# Patient Record
Sex: Female | Born: 1995 | Race: Black or African American | Hispanic: No | Marital: Single | Smoking: Never smoker
Health system: Southern US, Community
[De-identification: ages and names within clinical notes are randomized; demographics above are authoritative.]

## PROBLEM LIST (undated history)

## (undated) DIAGNOSIS — J45909 Unspecified asthma, uncomplicated: Secondary | ICD-10-CM

## (undated) DIAGNOSIS — B009 Herpesviral infection, unspecified: Secondary | ICD-10-CM

---

## 2016-08-22 ENCOUNTER — Emergency Department: Payer: Managed Care, Other (non HMO)

## 2016-08-22 ENCOUNTER — Encounter: Payer: Self-pay | Admitting: Emergency Medicine

## 2016-08-22 ENCOUNTER — Emergency Department
Admission: EM | Admit: 2016-08-22 | Discharge: 2016-08-22 | Disposition: A | Discharge status: Home / Self Care | Payer: Managed Care, Other (non HMO) | Attending: Student in an Organized Health Care Education/Training Program | Admitting: Student in an Organized Health Care Education/Training Program

## 2016-08-22 DIAGNOSIS — M545 Low back pain: Secondary | ICD-10-CM | POA: Diagnosis not present

## 2016-08-22 DIAGNOSIS — Y9389 Activity, other specified: Secondary | ICD-10-CM | POA: Diagnosis not present

## 2016-08-22 DIAGNOSIS — J45909 Unspecified asthma, uncomplicated: Secondary | ICD-10-CM | POA: Insufficient documentation

## 2016-08-22 DIAGNOSIS — M25571 Pain in right ankle and joints of right foot: Secondary | ICD-10-CM | POA: Insufficient documentation

## 2016-08-22 DIAGNOSIS — Y999 Unspecified external cause status: Secondary | ICD-10-CM | POA: Diagnosis not present

## 2016-08-22 DIAGNOSIS — Y92410 Unspecified street and highway as the place of occurrence of the external cause: Secondary | ICD-10-CM | POA: Diagnosis not present

## 2016-08-22 DIAGNOSIS — R51 Headache: Secondary | ICD-10-CM | POA: Insufficient documentation

## 2016-08-22 DIAGNOSIS — S0990XA Unspecified injury of head, initial encounter: Secondary | ICD-10-CM | POA: Diagnosis present

## 2016-08-22 HISTORY — DX: Unspecified asthma, uncomplicated: J45.909

## 2016-08-22 LAB — POCT PREGNANCY, URINE: Preg Test, Ur: NEGATIVE

## 2016-08-22 MED ORDER — TRAMADOL HCL 50 MG PO TABS: 50.0000 mg | ORAL_TABLET | Freq: Four times a day (QID) | ORAL | 0 refills | Status: AC | PRN

## 2016-08-22 MED ORDER — HYDROCODONE-ACETAMINOPHEN 5-325 MG PO TABS
1.0000 | ORAL_TABLET | Freq: Once | ORAL | Status: AC
  Administered 2016-08-22: 1 via ORAL
  Filled 2016-08-22: qty 1

## 2016-08-22 MED ORDER — CYCLOBENZAPRINE HCL 5 MG PO TABS: 5.0000 mg | ORAL_TABLET | Freq: Three times a day (TID) | ORAL | 0 refills | Status: AC | PRN
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
## 2016-08-22 NOTE — ED Notes (Signed)
Pt verbalized understanding of discharge instructions. NAD at this time. 

## 2016-08-22 NOTE — ED Notes (Signed)
This RN informed patient that urine sample was needed, pt given a urine cup. Pt ambulatory to the bathroom, c/o headache, this RN offered patient wheelchair several times, pt states "I'm okay, I can walk".
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
## 2020-10-18 ENCOUNTER — Emergency Department (HOSPITAL_COMMUNITY)
Admission: EM | Admit: 2020-10-18 | Discharge: 2020-10-18 | Disposition: A | Discharge status: Home / Self Care | Payer: No Typology Code available for payment source | Attending: Emergency Medicine | Admitting: Emergency Medicine

## 2020-10-18 ENCOUNTER — Encounter (HOSPITAL_COMMUNITY): Payer: Self-pay | Admitting: Emergency Medicine

## 2020-10-18 ENCOUNTER — Emergency Department (HOSPITAL_COMMUNITY): Payer: No Typology Code available for payment source

## 2020-10-18 DIAGNOSIS — J45909 Unspecified asthma, uncomplicated: Secondary | ICD-10-CM | POA: Insufficient documentation

## 2020-10-18 DIAGNOSIS — R202 Paresthesia of skin: Secondary | ICD-10-CM | POA: Diagnosis not present

## 2020-10-18 DIAGNOSIS — S161XXA Strain of muscle, fascia and tendon at neck level, initial encounter: Secondary | ICD-10-CM | POA: Diagnosis not present

## 2020-10-18 DIAGNOSIS — Y92410 Unspecified street and highway as the place of occurrence of the external cause: Secondary | ICD-10-CM | POA: Diagnosis not present

## 2020-10-18 DIAGNOSIS — S39012A Strain of muscle, fascia and tendon of lower back, initial encounter: Secondary | ICD-10-CM | POA: Diagnosis not present

## 2020-10-18 DIAGNOSIS — S199XXA Unspecified injury of neck, initial encounter: Secondary | ICD-10-CM | POA: Diagnosis present

## 2020-10-18 MED ORDER — DEXAMETHASONE SODIUM PHOSPHATE 10 MG/ML IJ SOLN
8.0000 mg | Freq: Once | INTRAMUSCULAR | Status: AC
  Administered 2020-10-18: 8 mg via INTRAMUSCULAR
  Filled 2020-10-18: qty 1

## 2020-10-18 MED ORDER — NAPROXEN 500 MG PO TABS
500.0000 mg | ORAL_TABLET | Freq: Two times a day (BID) | ORAL | 0 refills | Status: DC
Start: 2020-10-18 — End: 2023-04-07

## 2020-10-18 MED ORDER — CYCLOBENZAPRINE HCL 10 MG PO TABS: 10.0000 mg | ORAL_TABLET | Freq: Every day | ORAL | 0 refills | Status: AC

## 2020-10-18 MED ORDER — IBUPROFEN 400 MG PO TABS
600.0000 mg | ORAL_TABLET | Freq: Once | ORAL | Status: AC
  Administered 2020-10-18: 600 mg via ORAL
  Filled 2020-10-18: qty 1
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
## 2020-10-18 NOTE — Discharge Instructions (Addendum)
I am prescribing you a strong muscle relaxer called flexeril. Please only take this medication once in the evening with dinner. This medication can make you quite drowsy. Do not mix it with alcohol. Do not drive a vehicle after taking it.   I am also prescribed you naproxen.  This is a strong anti-inflammatory medication.  You can take this twice a day for management of your pain.  Please take this with a small amount of food to prevent stomach irritation.  Again, these medications are not safe to take during pregnancy.  If there is any concern that you could be pregnant, you need to take a pregnancy test at home prior to starting these medications.  Please continue to closely monitor your symptoms.  If you develop any new or worsening symptoms, please return to the emergency department immediately for reevaluation.  It was a pleasure to meet you.
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
## 2020-10-18 NOTE — ED Notes (Signed)
Dc instructions reviewed with pt. PT verbalized understanding. PT DC 

## 2023-04-07 ENCOUNTER — Ambulatory Visit (INDEPENDENT_AMBULATORY_CARE_PROVIDER_SITE_OTHER): Payer: Medicaid Other

## 2023-04-07 ENCOUNTER — Encounter (HOSPITAL_COMMUNITY): Payer: Self-pay

## 2023-04-07 ENCOUNTER — Ambulatory Visit (HOSPITAL_COMMUNITY)
Admission: EM | Admit: 2023-04-07 | Discharge: 2023-04-07 | Disposition: A | Discharge status: Home / Self Care | Payer: Medicaid Other | Attending: Family Medicine | Admitting: Family Medicine

## 2023-04-07 DIAGNOSIS — R079 Chest pain, unspecified: Secondary | ICD-10-CM

## 2023-04-07 LAB — POCT URINE PREGNANCY: Preg Test, Ur: NEGATIVE

## 2023-04-07 NOTE — ED Triage Notes (Signed)
Patient here today with c/o left side chest pain and dizziness X 1 hour. Patient has been massaging her chest which seemed to have helped some.

## 2023-04-08 ENCOUNTER — Encounter (HOSPITAL_BASED_OUTPATIENT_CLINIC_OR_DEPARTMENT_OTHER): Payer: Self-pay | Admitting: Emergency Medicine

## 2023-04-08 ENCOUNTER — Other Ambulatory Visit: Payer: Self-pay

## 2023-04-08 ENCOUNTER — Other Ambulatory Visit (HOSPITAL_BASED_OUTPATIENT_CLINIC_OR_DEPARTMENT_OTHER): Payer: Self-pay

## 2023-04-08 ENCOUNTER — Emergency Department (HOSPITAL_BASED_OUTPATIENT_CLINIC_OR_DEPARTMENT_OTHER)
Admission: EM | Admit: 2023-04-08 | Discharge: 2023-04-08 | Disposition: A | Discharge status: Home / Self Care | Payer: Medicaid Other | Attending: Emergency Medicine | Admitting: Emergency Medicine

## 2023-04-08 DIAGNOSIS — R079 Chest pain, unspecified: Secondary | ICD-10-CM | POA: Diagnosis present

## 2023-04-08 DIAGNOSIS — R0789 Other chest pain: Secondary | ICD-10-CM | POA: Diagnosis not present

## 2023-04-08 DIAGNOSIS — R0602 Shortness of breath: Secondary | ICD-10-CM | POA: Diagnosis not present

## 2023-04-08 LAB — BASIC METABOLIC PANEL
Anion gap: 8 (ref 5–15)
BUN: 18 mg/dL (ref 6–20)
CO2: 25 mmol/L (ref 22–32)
Calcium: 9.2 mg/dL (ref 8.9–10.3)
Chloride: 102 mmol/L (ref 98–111)
Creatinine, Ser: 0.95 mg/dL (ref 0.44–1.00)
GFR, Estimated: >60 mL/min (ref >60)
Glucose, Bld: 92 mg/dL (ref 70–99)
Potassium: 3.6 mmol/L (ref 3.5–5.1)
Sodium: 135 mmol/L (ref 135–145)

## 2023-04-08 LAB — CBC
HCT: 40 % (ref 36.0–46.0)
Hemoglobin: 13.4 g/dL (ref 12.0–15.0)
MCH: 30 pg (ref 26.0–34.0)
MCHC: 33.5 g/dL (ref 30.0–36.0)
MCV: 89.7 fL (ref 80.0–100.0)
Platelets: 323 10*3/uL (ref 150–400)
RBC: 4.46 MIL/uL (ref 3.87–5.11)
RDW: 13.1 % (ref 11.5–15.5)
WBC: 9.2 10*3/uL (ref 4.0–10.5)
nRBC: 0 % (ref 0.0–0.2)

## 2023-04-08 LAB — TROPONIN I (HIGH SENSITIVITY): Troponin I (High Sensitivity): <2 ng/L (ref <18)

## 2023-04-08 LAB — D-DIMER, QUANTITATIVE: D-Dimer, Quant: 0.4 ug{FEU}/mL (ref 0.00–0.50)

## 2023-04-08 MED ORDER — IBUPROFEN 600 MG PO TABS
600.0000 mg | ORAL_TABLET | Freq: Four times a day (QID) | ORAL | 0 refills | Status: DC | PRN
  Filled 2023-04-08: qty 30, 8d supply, fill #0

## 2023-04-08 MED ORDER — KETOROLAC TROMETHAMINE 15 MG/ML IJ SOLN
15.0000 mg | Freq: Once | INTRAMUSCULAR | Status: AC
Start: 2023-04-08 — End: 2023-04-08
  Administered 2023-04-08: 15 mg via INTRAVENOUS
  Filled 2023-04-08: qty 1

## 2023-04-08 NOTE — ED Triage Notes (Signed)
Left side chest heaviness and "slight" shortness of breath , was seen yesterday for same at [facility] . Denies cough or congestion .
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
## 2023-04-08 NOTE — ED Notes (Signed)

## 2023-04-08 NOTE — Discharge Instructions (Addendum)
As discussed, workup today overall reassuring.  D-dimer was negative so low suspicion for blood clot in the lung.  Your heart enzyme was normal.  Your EKG appeared normal.  Suspect your symptoms are likely secondary to left-sided chest wall pain or more musculoskeletal origin.  Will recommend treatment of pain at home with NSAIDs.  Recommend follow-up with primary care for reassessment of your symptoms.  Please do not hesitate to return to emergency department for worrisome signs and symptoms we discussed to become apparent.

## 2023-04-08 NOTE — ED Provider Notes (Signed)
[facility]ARE [facility]   [id number] 04/07/23 Arrival Time: 1606  ASSESSMENT & PLAN:  1. Chest pain, unspecified type    Low suspicion, but cannot r/o cardiac CP vs PE. Discussed. Recommend ED evaluation for more thorough workup. Pt agrees. To ED via POV. Stable upon discharge.  ECG: Performed today and interpreted by me: normal EKG, normal sinus rhythm.  I have personally viewed the imaging studies ordered this visit. CXR: no acute changes.  Labs Reviewed  POCT URINE PREGNANCY  Negative.  Reviewed expectations re: course of current medical issues. Questions answered. Outlined signs and symptoms indicating need for more acute intervention. Patient verbalized understanding. After Visit Summary given.   SUBJECTIVE:  History from: patient. Tasha Thompson is a 27 y.o. female who presents with complaint of L upper CP; abrupt onset; this morning; few min then eased. With associated diaphoresis. "12 out of 10 pain". Still present but around 2/10 pain. Denies assoc n/v. Ques mild SOB associated; none currently. Denies back/abd pain. No tx PTA. Denies recreational drug use.  Social History   Tobacco Use  Smoking Status Never  Smokeless Tobacco Never   Social History   Substance and Sexual Activity  Alcohol Use No     OBJECTIVE:  Vitals:   04/07/23 1711  BP: 127/79  Pulse: (!) 101  Resp: 16  Temp: 99 F (37.2 C)  TempSrc: Oral  SpO2: 97%  Weight: 90.7 kg  Height: 5\' 5"  (1.651 m)    General appearance: alert, oriented, no acute distress Eyes: PERRLA; EOMI; conjunctivae normal HENT: normocephalic; atraumatic Neck: supple with FROM Lungs: without labored respirations; speaks full sentences without difficulty; CTAB Heart: regular rate and rhythm without murmer Chest Wall: without tenderness to palpation Abdomen: soft, non-tender; no guarding or rebound tenderness Extremities: without edema; without calf swelling or tenderness; symmetrical without gross  deformities Skin: warm and dry; without rash or lesions Neuro: normal gait Psychological: alert and cooperative; normal mood and affect  Labs: Results for orders placed or performed during the hospital encounter of 04/07/23  POCT urine pregnancy  Result Value Ref Range   Preg Test, Ur Negative Negative   Labs Reviewed  POCT URINE PREGNANCY    Imaging: DG Chest 2 View  Result Date: 04/07/2023 CLINICAL DATA:  CP left side chest pain and dizziness X 1 hour. Patient has been massaging her chest which seemed to have helped some. EXAM: CHEST - 2 VIEW COMPARISON:  None Available. FINDINGS: The heart and mediastinal contours are within normal limits. No focal consolidation. No pulmonary edema. No pleural effusion. No pneumothorax. No acute osseous abnormality. IMPRESSION: No active cardiopulmonary disease. Electronically Signed   By: Tish Frederickson M.D.   On: 04/07/2023 19:52     No Known Allergies  Past Medical History:  Diagnosis Date   Asthma    Social History   Socioeconomic History   Marital status: Single    Spouse name: Not on file   Number of children: Not on file   Years of education: Not on file   Highest education level: Not on file  Occupational History   Not on file  Tobacco Use   Smoking status: Never   Smokeless tobacco: Never  Substance and Sexual Activity   Alcohol use: No   Drug use: No   Sexual activity: Not on file  Other Topics Concern   Not on file  Social History Narrative   Not on file   Social Determinants of Health   Financial Resource Strain: Low Risk  (  10/04/2019)   Received from [facility]   Overall Financial Resource Strain (CARDIA)    Difficulty of Paying Living Expenses: Not very hard  Food Insecurity: No Food Insecurity (10/04/2019)   Received from [facility]   Hunger Vital Sign    Worried About Running Out of Food in the Last Year: Never true    Ran Out of Food in the Last Year: Never true  Transportation Needs: No  Transportation Needs (10/22/2020)   Received from [facility] - Transportation    Lack of Transportation (Medical): No    Lack of Transportation (Non-Medical): No  Physical Activity: Insufficiently Active (09/27/2020)   Received from [facility]   Exercise Vital Sign    Days of Exercise per Week: 3 days    Minutes of Exercise per Session: 30 min  Stress: Stress Concern Present (10/04/2019)   Received from [facility] of Occupational Health - Occupational Stress Questionnaire    Feeling of Stress : To some extent  Social Connections: Moderately Isolated (10/04/2019)   Received from [facility]   Social Connection and Isolation Panel [NHANES]    Frequency of Communication with Friends and Family: Three times a week    Frequency of Social Gatherings with Friends and Family: Twice a week    Attends Religious Services: 1 to 4 times per year    Active Member of [employer] or Organizations: No    Attends Banker Meetings: Never    Marital Status: Never married  Intimate Partner Violence: Not At Risk (10/04/2019)   Received from [facility]   Humiliation, Afraid, Rape, and Kick questionnaire    Fear of Current or Ex-Partner: No    Emotionally Abused: No    Physically Abused: No    Sexually Abused: No   History reviewed. No pertinent family history. History reviewed. No pertinent surgical history.    Mardella Layman, MD 04/08/23 1218

## 2023-04-19 ENCOUNTER — Other Ambulatory Visit (HOSPITAL_BASED_OUTPATIENT_CLINIC_OR_DEPARTMENT_OTHER): Payer: Self-pay

## 2023-07-06 ENCOUNTER — Ambulatory Visit (HOSPITAL_COMMUNITY)
Admission: EM | Admit: 2023-07-06 | Discharge: 2023-07-06 | Disposition: A | Discharge status: Home / Self Care | Payer: Medicaid Other | Attending: Family Medicine | Admitting: Family Medicine

## 2023-07-06 ENCOUNTER — Encounter (HOSPITAL_COMMUNITY): Payer: Self-pay

## 2023-07-06 DIAGNOSIS — N760 Acute vaginitis: Secondary | ICD-10-CM | POA: Insufficient documentation

## 2023-07-06 NOTE — Discharge Instructions (Signed)
Staff will notify you if there is anything positive on the swab.  You can use the QR code/website at the back of the summary paperwork to schedule yourself a new patient appointment with primary care.  When you make that appointment, then please notify Medicaid so they can assign you that new primary care person.

## 2023-07-06 NOTE — ED Triage Notes (Signed)
Patient here today with c/o vaginal itching X 1 week.

## 2023-07-06 NOTE — ED Provider Notes (Signed)
[facility]    CSN: [id number] Arrival date & time: 07/06/23  1555      History   Chief Complaint Chief Complaint  Patient presents with   Vaginal Itching    HPI Tasha Thompson is a 28 y.o. female.    Vaginal Itching  Here for some vaginal irritation and intermittent itching.  It has been going on for a week or 2.  She wonders if it was some exposure to some different soaps she had used.  She is also had a little bit of discharge.  No abnormal smell.  No abdominal pain  Last menstrual cycle was about December 21 or 22.  NKDA  She did also have some irritation in her mouth after eating some not completely right kiwi.  That is resolved.  Past Medical History:  Diagnosis Date   Asthma     There are no active problems to display for this patient.   History reviewed. No pertinent surgical history.  OB History   No obstetric history on file.      Home Medications    Prior to Admission medications   Medication Sig Start Date End Date Taking? Authorizing Provider  valACYclovir (VALTREX) 1000 MG tablet Take 1 tablet by mouth daily. 03/23/23   [provider]    Family History History reviewed. No pertinent family history.  Social History Social History   Tobacco Use   Smoking status: Never   Smokeless tobacco: Never  Substance Use Topics   Alcohol use: No   Drug use: No     Allergies   Patient has no known allergies.   Review of Systems Review of Systems   Physical Exam Triage Vital Signs ED Triage Vitals  Encounter Vitals Group     BP 07/06/23 1630 115/81     Systolic BP Percentile --      Diastolic BP Percentile --      Pulse Rate 07/06/23 1630 98     Resp 07/06/23 1630 16     Temp 07/06/23 1630 98.4 F (36.9 C)     Temp Source 07/06/23 1630 Oral     SpO2 07/06/23 1630 97 %     Weight 07/06/23 1630 190 lb (86.2 kg)     Height 07/06/23 1630 5\' 6"  (1.676 m)     Head Circumference --      Peak Flow --      Pain  Score 07/06/23 1619 0     Pain Loc --      Pain Education --      Exclude from Growth Chart --    No data found.  Updated Vital Signs BP 115/81 (BP Location: Right Arm)   Pulse 98   Temp 98.4 F (36.9 C) (Oral)   Resp 16   Ht 5\' 6"  (1.676 m)   Wt 86.2 kg   LMP 06/05/2023 (Exact Date)   SpO2 97%   BMI 30.67 kg/m   Visual Acuity Right Eye Distance:   Left Eye Distance:   Bilateral Distance:    Right Eye Near:   Left Eye Near:    Bilateral Near:     Physical Exam Vitals reviewed.  Constitutional:      General: She is not in acute distress.    Appearance: She is not ill-appearing, toxic-appearing or diaphoretic.  HENT:     Mouth/Throat:     Mouth: Mucous membranes are moist.  Skin:    Coloration: Skin is not pale.  Neurological:  Mental Status: She is alert and oriented to person, place, and time.  Psychiatric:        Behavior: Behavior normal.      UC Treatments / Results  Labs (all labs ordered are listed, but only abnormal results are displayed) Labs Reviewed  CERVICOVAGINAL ANCILLARY ONLY    EKG   Radiology No results found.  Procedures Procedures (including critical care time)  Medications Ordered in UC Medications - No data to display  Initial Impression / Assessment and Plan / UC Course  I have reviewed the triage vital signs and the nursing notes.  Pertinent labs & imaging results that were available during my care of the patient were reviewed by me and considered in my medical decision making (see chart for details).     Vaginal self swab is done, and we will notify of any positives on that and treat per protocol. We decided against empiric treatment.  We discussed potential allergy symptoms.  She does not have a primary care in the area and we discussed how she can self schedule on the [facility] health website with a new PCP  Final Clinical Impressions(s) / UC Diagnoses   Final diagnoses:  Acute vaginitis     Discharge  Instructions      Staff will notify you if there is anything positive on the swab.  You can use the QR code/website at the back of the summary paperwork to schedule yourself a new patient appointment with primary care.  When you make that appointment, then please notify Medicaid so they can assign you that new primary care person.      ED Prescriptions   None    PDMP not reviewed this encounter.   Zenia Resides, MD 07/06/23 [phone]

## 2023-07-07 LAB — CERVICOVAGINAL ANCILLARY ONLY
Bacterial Vaginitis (gardnerella): NEGATIVE
Candida Glabrata: NEGATIVE
Candida Vaginitis: NEGATIVE
Chlamydia: NEGATIVE
Comment: NEGATIVE
Comment: NEGATIVE
Comment: NEGATIVE
Comment: NEGATIVE
Comment: NEGATIVE
Comment: NORMAL
Neisseria Gonorrhea: NEGATIVE
Trichomonas: NEGATIVE

## 2023-07-28 ENCOUNTER — Other Ambulatory Visit: Payer: Self-pay

## 2023-07-28 ENCOUNTER — Emergency Department (HOSPITAL_COMMUNITY)
Admission: EM | Admit: 2023-07-28 | Discharge: 2023-07-28 | Disposition: A | Discharge status: Home / Self Care | Payer: Medicaid Other | Attending: Emergency Medicine | Admitting: Emergency Medicine

## 2023-07-28 ENCOUNTER — Emergency Department (HOSPITAL_COMMUNITY): Payer: Medicaid Other

## 2023-07-28 DIAGNOSIS — S0003XA Contusion of scalp, initial encounter: Secondary | ICD-10-CM | POA: Insufficient documentation

## 2023-07-28 DIAGNOSIS — S29012A Strain of muscle and tendon of back wall of thorax, initial encounter: Secondary | ICD-10-CM | POA: Diagnosis not present

## 2023-07-28 DIAGNOSIS — S29002A Unspecified injury of muscle and tendon of back wall of thorax, initial encounter: Secondary | ICD-10-CM | POA: Diagnosis present

## 2023-07-28 DIAGNOSIS — R109 Unspecified abdominal pain: Secondary | ICD-10-CM | POA: Insufficient documentation

## 2023-07-28 DIAGNOSIS — R079 Chest pain, unspecified: Secondary | ICD-10-CM | POA: Insufficient documentation

## 2023-07-28 DIAGNOSIS — S0990XA Unspecified injury of head, initial encounter: Secondary | ICD-10-CM

## 2023-07-28 DIAGNOSIS — R55 Syncope and collapse: Secondary | ICD-10-CM | POA: Diagnosis not present

## 2023-07-28 DIAGNOSIS — Y92410 Unspecified street and highway as the place of occurrence of the external cause: Secondary | ICD-10-CM | POA: Diagnosis not present

## 2023-07-28 DIAGNOSIS — S39012A Strain of muscle, fascia and tendon of lower back, initial encounter: Secondary | ICD-10-CM

## 2023-07-28 LAB — I-STAT CHEM 8, ED
BUN: 17 mg/dL (ref 6–20)
Calcium, Ion: 1.2 mmol/L (ref 1.15–1.40)
Chloride: 105 mmol/L (ref 98–111)
Creatinine, Ser: 1.1 mg/dL — ABNORMAL HIGH (ref 0.44–1.00)
Glucose, Bld: 96 mg/dL (ref 70–99)
HCT: 41 % (ref 36.0–46.0)
Hemoglobin: 13.9 g/dL (ref 12.0–15.0)
Potassium: 3.5 mmol/L (ref 3.5–5.1)
Sodium: 138 mmol/L (ref 135–145)
TCO2: 22 mmol/L (ref 22–32)

## 2023-07-28 LAB — COMPREHENSIVE METABOLIC PANEL
ALT: 25 U/L (ref 0–44)
AST: 22 U/L (ref 15–41)
Albumin: 4.1 g/dL (ref 3.5–5.0)
Alkaline Phosphatase: 73 U/L (ref 38–126)
Anion gap: 11 (ref 5–15)
BUN: 18 mg/dL (ref 6–20)
CO2: 21 mmol/L — ABNORMAL LOW (ref 22–32)
Calcium: 9.5 mg/dL (ref 8.9–10.3)
Chloride: 104 mmol/L (ref 98–111)
Creatinine, Ser: 1.03 mg/dL — ABNORMAL HIGH (ref 0.44–1.00)
GFR, Estimated: >60 mL/min (ref >60)
Glucose, Bld: 100 mg/dL — ABNORMAL HIGH (ref 70–99)
Potassium: 3.4 mmol/L — ABNORMAL LOW (ref 3.5–5.1)
Sodium: 136 mmol/L (ref 135–145)
Total Bilirubin: 0.5 mg/dL (ref 0.0–1.2)
Total Protein: 8.3 g/dL — ABNORMAL HIGH (ref 6.5–8.1)

## 2023-07-28 LAB — CBC
HCT: 39.7 % (ref 36.0–46.0)
Hemoglobin: 13 g/dL (ref 12.0–15.0)
MCH: 30.4 pg (ref 26.0–34.0)
MCHC: 32.7 g/dL (ref 30.0–36.0)
MCV: 93 fL (ref 80.0–100.0)
Platelets: 287 10*3/uL (ref 150–400)
RBC: 4.27 MIL/uL (ref 3.87–5.11)
RDW: 13.9 % (ref 11.5–15.5)
WBC: 8.8 10*3/uL (ref 4.0–10.5)
nRBC: 0 % (ref 0.0–0.2)

## 2023-07-28 LAB — URINALYSIS, ROUTINE W REFLEX MICROSCOPIC
Bilirubin Urine: NEGATIVE
Glucose, UA: NEGATIVE mg/dL
Hgb urine dipstick: NEGATIVE
Ketones, ur: NEGATIVE mg/dL
Leukocytes,Ua: NEGATIVE
Nitrite: NEGATIVE
Protein, ur: NEGATIVE mg/dL
Specific Gravity, Urine: 1.016 (ref 1.005–1.030)
pH: 6 (ref 5.0–8.0)

## 2023-07-28 LAB — ETHANOL: Alcohol, Ethyl (B): <10 mg/dL (ref <10)

## 2023-07-28 LAB — HCG, SERUM, QUALITATIVE: Preg, Serum: NEGATIVE

## 2023-07-28 MED ORDER — IOHEXOL 300 MG/ML  SOLN
100.0000 mL | Freq: Once | INTRAMUSCULAR | Status: AC | PRN
  Administered 2023-07-28: 100 mL via INTRAVENOUS

## 2023-07-28 MED ORDER — IBUPROFEN 800 MG PO TABS: 800.0000 mg | ORAL_TABLET | Freq: Three times a day (TID) | ORAL | 0 refills | Status: DC

## 2023-07-28 MED ORDER — CYCLOBENZAPRINE HCL 5 MG PO TABS: 5.0000 mg | ORAL_TABLET | Freq: Three times a day (TID) | ORAL | 0 refills | Status: DC | PRN

## 2023-07-28 MED ORDER — ONDANSETRON HCL 4 MG/2ML IJ SOLN
4.0000 mg | Freq: Once | INTRAMUSCULAR | Status: AC
  Administered 2023-07-28: 4 mg via INTRAVENOUS
  Filled 2023-07-28: qty 2

## 2023-07-28 MED ORDER — MORPHINE SULFATE (PF) 4 MG/ML IV SOLN
4.0000 mg | Freq: Once | INTRAVENOUS | Status: AC
  Administered 2023-07-28: 4 mg via INTRAVENOUS
  Filled 2023-07-28: qty 1

## 2023-07-28 MED ORDER — ONDANSETRON HCL 4 MG/2ML IJ SOLN
4.0000 mg | Freq: Once | INTRAMUSCULAR | Status: AC
  Administered 2023-07-28: 4 mg via INTRAVENOUS

## 2023-07-28 MED ORDER — ONDANSETRON 4 MG PO TBDP: ORAL_TABLET | ORAL | 0 refills | Status: DC

## 2023-07-28 MED ORDER — HYDROCODONE-ACETAMINOPHEN 5-325 MG PO TABS
1.0000 | ORAL_TABLET | ORAL | 0 refills | Status: DC | PRN
Start: 2023-07-28 — End: 2023-08-09

## 2023-07-28 MED ORDER — LACTATED RINGERS IV BOLUS
1000.0000 mL | Freq: Once | INTRAVENOUS | Status: AC
  Administered 2023-07-28: 1000 mL via INTRAVENOUS
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
## 2023-08-09 ENCOUNTER — Ambulatory Visit (HOSPITAL_COMMUNITY)
Admission: EM | Admit: 2023-08-09 | Discharge: 2023-08-09 | Disposition: A | Discharge status: Home / Self Care | Payer: Medicaid Other | Attending: Emergency Medicine | Admitting: Emergency Medicine

## 2023-08-09 ENCOUNTER — Encounter (HOSPITAL_COMMUNITY): Payer: Self-pay | Admitting: Emergency Medicine

## 2023-08-09 DIAGNOSIS — N898 Other specified noninflammatory disorders of vagina: Secondary | ICD-10-CM | POA: Diagnosis not present

## 2023-08-09 DIAGNOSIS — Z113 Encounter for screening for infections with a predominantly sexual mode of transmission: Secondary | ICD-10-CM | POA: Diagnosis not present

## 2023-08-09 HISTORY — DX: Herpesviral infection, unspecified: B00.9

## 2023-08-09 NOTE — Discharge Instructions (Signed)
 Your results will come back over the next few days and someone will call if results are positive and require treatment.  Please continue taking your Valtrex prescription daily. If you have a true outbreak with sores you can return here for re-evaluation or follow-up with primary care to discuss adjusting current treatment plan if it is not working.

## 2023-08-09 NOTE — ED Provider Notes (Signed)
 [facility]    CSN: [id number] Arrival date & time: 08/09/23  1740      History   Chief Complaint Chief Complaint  Patient presents with   Vaginal Discharge    HPI Tasha Thompson is a 28 y.o. female.   Patient presents with vaginal discharge, itching, and irritation after recent unprotected sexual intercourse. Patient also reports an "HSV outbreak". Patient denies any lesions, but states that she feels like she has having a HSV flare.   Patient states that she was taking Valtrex daily, but stopped taking it because she was not having any HSV flares and felt like she did not need to take it daily. Patient states that she started retaking the Valtrex today.    Vaginal Discharge   Past Medical History:  Diagnosis Date   Asthma    HSV infection     There are no active problems to display for this patient.   History reviewed. No pertinent surgical history.  OB History   No obstetric history on file.      Home Medications    Prior to Admission medications   Medication Sig Start Date End Date Taking? Authorizing Provider  valACYclovir (VALTREX) 1000 MG tablet Take 1 tablet by mouth daily. 03/23/23   [provider]    Family History No family history on file.  Social History Social History   Tobacco Use   Smoking status: Never   Smokeless tobacco: Never  Substance Use Topics   Alcohol use: No   Drug use: No     Allergies   Patient has no known allergies.   Review of Systems Review of Systems  Genitourinary:  Positive for vaginal discharge.   Per HPI  Physical Exam Triage Vital Signs ED Triage Vitals  Encounter Vitals Group     BP 08/09/23 1839 121/78     Systolic BP Percentile --      Diastolic BP Percentile --      Pulse Rate 08/09/23 1839 96     Resp 08/09/23 1839 14     Temp 08/09/23 1839 98.7 F (37.1 C)     Temp Source 08/09/23 1839 Oral     SpO2 08/09/23 1839 96 %     Weight --      Height --      Head  Circumference --      Peak Flow --      Pain Score 08/09/23 1836 7     Pain Loc --      Pain Education --      Exclude from Growth Chart --    No data found.  Updated Vital Signs BP 121/78 (BP Location: Left Arm)   Pulse 96   Temp 98.7 F (37.1 C) (Oral)   Resp 14   LMP 08/02/2023   SpO2 96%   Visual Acuity Right Eye Distance:   Left Eye Distance:   Bilateral Distance:    Right Eye Near:   Left Eye Near:    Bilateral Near:     Physical Exam Vitals and nursing note reviewed.  Constitutional:      General: She is not in acute distress.    Appearance: Normal appearance. She is not ill-appearing.  Abdominal:     General: Abdomen is flat. Bowel sounds are normal.     Palpations: Abdomen is soft.     Tenderness: There is no abdominal tenderness.  Genitourinary:    Comments: Exam declined.  Skin:    General: Skin is  warm and dry.  Neurological:     Mental Status: She is alert.      UC Treatments / Results  Labs (all labs ordered are listed, but only abnormal results are displayed) Labs Reviewed  CERVICOVAGINAL ANCILLARY ONLY    EKG   Radiology No results found.  Procedures Procedures (including critical care time)  Medications Ordered in UC Medications - No data to display  Initial Impression / Assessment and Plan / UC Course  I have reviewed the triage vital signs and the nursing notes.  Pertinent labs & imaging results that were available during my care of the patient were reviewed by me and considered in my medical decision making (see chart for details).     Patient presented with vaginal discharge, itching, and irritation after recent unprotected sexual intercourse.  Patient also reports an "HSV outbreak", but denies any lesions and states that it just feels like she is having a HSV flare.  Patient is prescribed Valtrex to take daily, but states she has not been taking it daily and just restarted it today.  No significant findings upon  assessment.  GU exam declined.  Patient perform self swab for STD/STI.  Declined HIV and syphilis testing.  Discussed follow-up and return precautions. Final Clinical Impressions(s) / UC Diagnoses   Final diagnoses:  Vaginal discharge  Screening for STD (sexually transmitted disease)     Discharge Instructions      Your results will come back over the next few days and someone will call if results are positive and require treatment.  Please continue taking your Valtrex prescription daily. If you have a true outbreak with sores you can return here for re-evaluation or follow-up with primary care to discuss adjusting current treatment plan if it is not working.     ED Prescriptions   None    PDMP not reviewed this encounter.   Wynonia Lawman A, NP 08/10/23 1010

## 2023-08-09 NOTE — ED Triage Notes (Signed)
 Pt reports had intercourse 2 Sundays ago. Reports last week started having vaginal itching, irritation, and discharge. Having a current HSV breakout.

## 2023-08-10 ENCOUNTER — Telehealth (HOSPITAL_COMMUNITY): Payer: Self-pay

## 2023-08-10 LAB — CERVICOVAGINAL ANCILLARY ONLY
Bacterial Vaginitis (gardnerella): POSITIVE — AB
Candida Glabrata: NEGATIVE
Candida Vaginitis: NEGATIVE
Chlamydia: NEGATIVE
Comment: NEGATIVE
Comment: NEGATIVE
Comment: NEGATIVE
Comment: NEGATIVE
Comment: NEGATIVE
Comment: NORMAL
Neisseria Gonorrhea: NEGATIVE
Trichomonas: NEGATIVE

## 2023-08-10 MED ORDER — METRONIDAZOLE 500 MG PO TABS: 500.0000 mg | ORAL_TABLET | Freq: Two times a day (BID) | ORAL | 0 refills | Status: DC

## 2023-08-10 NOTE — Telephone Encounter (Signed)
 Per protocol, pt requires tx with metronidazole. Reviewed with patient, verified pharmacy, prescription sent.

## 2024-03-02 ENCOUNTER — Encounter (HOSPITAL_COMMUNITY): Payer: Self-pay

## 2024-03-02 ENCOUNTER — Ambulatory Visit (HOSPITAL_COMMUNITY)
Admission: EM | Admit: 2024-03-02 | Discharge: 2024-03-02 | Disposition: A | Discharge status: Home / Self Care | Attending: Internal Medicine | Admitting: Internal Medicine

## 2024-03-02 DIAGNOSIS — N760 Acute vaginitis: Secondary | ICD-10-CM | POA: Insufficient documentation

## 2024-03-02 DIAGNOSIS — Z8619 Personal history of other infectious and parasitic diseases: Secondary | ICD-10-CM | POA: Diagnosis not present

## 2024-03-02 MED ORDER — FLUCONAZOLE 150 MG PO TABS: 150.0000 mg | ORAL_TABLET | ORAL | 0 refills | Status: DC

## 2024-03-02 NOTE — ED Provider Notes (Signed)
 [facility]    CSN: [id number] Arrival date & time: 03/02/24  1405      History   Chief Complaint Chief Complaint  Patient presents with   Vaginal Itching    HPI Tasha Thompson is a 28 y.o. female.   Tasha Thompson is a 28 y.o. female presenting for chief complaint of Vaginal Itching and vaginal discharge that started 6-7 days ago. She has had minimal and very slight odor to the vaginal region. Denies concern for STD. She is sexually active with 1 female partner with protection.  She denies rashes to the vaginal region, dyspareunia, pelvic pain, N/V/D, urinary symptoms, and chance of pregnancy.  Denies recent antibiotic use in the last 90 days. She has not attempted treatment of symptoms at home.  History of HSV, denies symptoms of current outbreak, takes valtrex daily.  LMP 02/17/2024, denies chance of pregnancy.    Vaginal Itching    Past Medical History:  Diagnosis Date   Asthma    HSV infection     There are no active problems to display for this patient.   History reviewed. No pertinent surgical history.  OB History   No obstetric history on file.      Home Medications    Prior to Admission medications   Medication Sig Start Date End Date Taking? Authorizing Provider  fluconazole  (DIFLUCAN ) 150 MG tablet Take 1 tablet (150 mg total) by mouth every 3 (three) days. 03/02/24  Yes Enedelia Dorna HERO, FNP  FLUoxetine (PROZAC) 10 MG capsule Take 10 mg by mouth daily. 02/22/24 02/21/25 Yes [provider]  metroNIDAZOLE  (FLAGYL ) 500 MG tablet Take 1 tablet (500 mg total) by mouth 2 (two) times daily. 08/10/23   Banister, Pamela K, MD  valACYclovir (VALTREX) 1000 MG tablet Take 1 tablet by mouth daily. 03/23/23   [provider]    Family History History reviewed. No pertinent family history.  Social History Social History   Tobacco Use   Smoking status: Never   Smokeless tobacco: Never  Substance Use Topics   Alcohol use: No   Drug  use: No     Allergies   Patient has no known allergies.   Review of Systems Review of Systems Per HPI  Physical Exam Triage Vital Signs ED Triage Vitals  Encounter Vitals Group     BP 03/02/24 1434 118/88     Girls Systolic BP Percentile --      Girls Diastolic BP Percentile --      Boys Systolic BP Percentile --      Boys Diastolic BP Percentile --      Pulse Rate 03/02/24 1434 83     Resp 03/02/24 1434 16     Temp 03/02/24 1434 98.1 F (36.7 C)     Temp Source 03/02/24 1434 Oral     SpO2 03/02/24 1434 96 %     Weight --      Height --      Head Circumference --      Peak Flow --      Pain Score 03/02/24 1432 0     Pain Loc --      Pain Education --      Exclude from Growth Chart --    No data found.  Updated Vital Signs BP 118/88 (BP Location: Left Arm)   Pulse 83   Temp 98.1 F (36.7 C) (Oral)   Resp 16   LMP 02/17/2024 (Approximate)   SpO2 96%   Visual Acuity  Right Eye Distance:   Left Eye Distance:   Bilateral Distance:    Right Eye Near:   Left Eye Near:    Bilateral Near:     Physical Exam Vitals and nursing note reviewed.  Constitutional:      Appearance: She is not ill-appearing or toxic-appearing.  HENT:     Head: Normocephalic and atraumatic.     Right Ear: Hearing and external ear normal.     Left Ear: Hearing and external ear normal.     Nose: Nose normal.     Mouth/Throat:     Lips: Pink.  Eyes:     General: Lids are normal. Vision grossly intact. Gaze aligned appropriately.     Extraocular Movements: Extraocular movements intact.     Conjunctiva/sclera: Conjunctivae normal.  Pulmonary:     Effort: Pulmonary effort is normal.  Musculoskeletal:     Cervical back: Neck supple.  Skin:    General: Skin is warm and dry.     Capillary Refill: Capillary refill takes less than 2 seconds.     Findings: No rash.  Neurological:     General: No focal deficit present.     Mental Status: She is alert and oriented to person, place, and  time. Mental status is at baseline.     Cranial Nerves: No dysarthria or facial asymmetry.  Psychiatric:        Mood and Affect: Mood normal.        Speech: Speech normal.        Behavior: Behavior normal.        Thought Content: Thought content normal.        Judgment: Judgment normal.      UC Treatments / Results  Labs (all labs ordered are listed, but only abnormal results are displayed) Labs Reviewed  CERVICOVAGINAL ANCILLARY ONLY    EKG   Radiology No results found.  Procedures Procedures (including critical care time)  Medications Ordered in UC Medications - No data to display  Initial Impression / Assessment and Plan / UC Course  I have reviewed the triage vital signs and the nursing notes.  Pertinent labs & imaging results that were available during my care of the patient were reviewed by me and considered in my medical decision making (see chart for details).   1. Acute vaginitis BV and yeast labs pending, no concern for STI. Will go ahead and treat with diflucan  for yeast vaginitis given symptoms and history of same.  Discussed tips for BV/yeast prevention in the future. Recommend follow-up with OB/GYN PRN.    Counseled patient on potential for adverse effects with medications prescribed/recommended today, strict ER and return-to-clinic precautions discussed, patient verbalized understanding.    Final Clinical Impressions(s) / UC Diagnoses   Final diagnoses:  Acute vaginitis     Discharge Instructions      I suspect your vaginal symptoms are due to vaginal yeast infection.   Diflucan  has been sent to treat this.  Take 1 pill today, then another pill in 3 days if you are still having vaginal symptoms.   Your swab has been sent for testing, staff will call you in 2-3 days if you test positive for any other infections and will provide treatment at that time.  Wear cotton underwear and avoid wearing tight clothing to prevent vaginal yeast infections  in the future.   Return to urgent care as needed and follow-up with your primary care provider for further evaluation and management of your symptoms.SABRA  I hope you feel better!       ED Prescriptions     Medication Sig Dispense Auth. Provider   fluconazole  (DIFLUCAN ) 150 MG tablet Take 1 tablet (150 mg total) by mouth every 3 (three) days. 2 tablet Enedelia Dorna HERO, FNP      PDMP not reviewed this encounter.   Enedelia Dorna HERO, [place] 03/02/24 1552

## 2024-03-02 NOTE — ED Triage Notes (Signed)
 Pt states vaginal itching and clear discharge for the past week.

## 2024-03-02 NOTE — Discharge Instructions (Addendum)
 I suspect your vaginal symptoms are due to vaginal yeast infection.   Diflucan  has been sent to treat this.  Take 1 pill today, then another pill in 3 days if you are still having vaginal symptoms.   Your swab has been sent for testing, staff will call you in 2-3 days if you test positive for any other infections and will provide treatment at that time.  Wear cotton underwear and avoid wearing tight clothing to prevent vaginal yeast infections in the future.   Return to urgent care as needed and follow-up with your primary care provider for further evaluation and management of your symptoms..  I hope you feel better!

## 2024-03-03 LAB — CERVICOVAGINAL ANCILLARY ONLY
Bacterial Vaginitis (gardnerella): NEGATIVE
Candida Glabrata: NEGATIVE
Candida Vaginitis: NEGATIVE
Comment: NEGATIVE
Comment: NEGATIVE
Comment: NEGATIVE

## 2024-05-05 ENCOUNTER — Encounter (HOSPITAL_COMMUNITY): Payer: Self-pay

## 2024-05-05 ENCOUNTER — Ambulatory Visit (HOSPITAL_COMMUNITY)
Admission: EM | Admit: 2024-05-05 | Discharge: 2024-05-05 | Disposition: A | Discharge status: Home / Self Care | Attending: Emergency Medicine | Admitting: Emergency Medicine

## 2024-05-05 DIAGNOSIS — J029 Acute pharyngitis, unspecified: Secondary | ICD-10-CM | POA: Insufficient documentation

## 2024-05-05 LAB — POCT RAPID STREP A (OFFICE): Rapid Strep A Screen: NEGATIVE

## 2024-05-05 MED ORDER — AZELASTINE HCL 0.1 % NA SOLN: 2.0000 | Freq: Two times a day (BID) | NASAL | 0 refills | Status: AC

## 2024-05-05 MED ORDER — SALINE SPRAY 0.65 % NA SOLN: 1.0000 | NASAL | 0 refills | Status: AC | PRN

## 2024-05-05 MED ORDER — LORATADINE 10 MG PO TABS: 10.0000 mg | ORAL_TABLET | Freq: Every day | ORAL | 0 refills | Status: AC

## 2024-05-05 NOTE — Discharge Instructions (Addendum)
 You will get a call if a test is positive or abnormal, you will not get a call if tests are negative  or normal but you can check results in MyChart if you have a MyChart account.   I recommend trying nasal spray and loratadine  (generic for Claritin ) to help your sore throat and the drainage down your throat.

## 2024-05-05 NOTE — ED Provider Notes (Signed)
 [facility]    CSN: [id number] Arrival date & time: 05/05/24  1118      History   Chief Complaint Chief Complaint  Patient presents with   Sore Throat    HPI Tasha Thompson is a 28 y.o. female.   She has had a sore throat for a week that is not getting better. Feels like something is swollen/in her throat. Also has post nasal drainage but no nasal congestion. No fever or chills.   Sore throat is very bothersome. She looked up possible causes and strep and STI were 2 possibilities. She would like checked for strep and STIs - does have unprotected sex with partners. No vaginal discharge or GU c/o.    Sore Throat    Past Medical History:  Diagnosis Date   Asthma    HSV infection     There are no active problems to display for this patient.   History reviewed. No pertinent surgical history.  OB History   No obstetric history on file.      Home Medications    Prior to Admission medications   Medication Sig Start Date End Date Taking? Authorizing Provider  azelastine  (ASTELIN ) 0.1 % nasal spray Place 2 sprays into both nostrils 2 (two) times daily. Use in each nostril as directed 05/05/24  Yes Jim Lundin, Jon HERO, NP  loratadine  (CLARITIN ) 10 MG tablet Take 1 tablet (10 mg total) by mouth daily. 05/05/24  Yes Richad Jon HERO, NP  sodium chloride (OCEAN) 0.65 % SOLN nasal spray Place 1 spray into both nostrils as needed for congestion. 05/05/24  Yes Richad Jon HERO, NP  FLUoxetine (PROZAC) 10 MG capsule Take 10 mg by mouth daily. 02/22/24 02/21/25  [provider]  valACYclovir (VALTREX) 1000 MG tablet Take 1 tablet by mouth daily. 03/23/23   [provider]    Family History History reviewed. No pertinent family history.  Social History Social History   Tobacco Use   Smoking status: Never   Smokeless tobacco: Never  Substance Use Topics   Alcohol use: No   Drug use: No     Allergies   Patient has no known allergies.   Review of  Systems Review of Systems   Physical Exam Triage Vital Signs ED Triage Vitals  Encounter Vitals Group     BP 05/05/24 1344 124/87     Girls Systolic BP Percentile --      Girls Diastolic BP Percentile --      Boys Systolic BP Percentile --      Boys Diastolic BP Percentile --      Pulse Rate 05/05/24 1344 94     Resp 05/05/24 1344 16     Temp 05/05/24 1344 98.4 F (36.9 C)     Temp Source 05/05/24 1344 Oral     SpO2 05/05/24 1344 96 %     Weight --      Height --      Head Circumference --      Peak Flow --      Pain Score 05/05/24 1341 2     Pain Loc --      Pain Education --      Exclude from Growth Chart --    No data found.  Updated Vital Signs BP 124/87 (BP Location: Left Arm)   Pulse 94   Temp 98.4 F (36.9 C) (Oral)   Resp 16   LMP 04/14/2024 (Approximate)   SpO2 96%   Visual Acuity Right Eye Distance:  Left Eye Distance:   Bilateral Distance:    Right Eye Near:   Left Eye Near:    Bilateral Near:     Physical Exam Constitutional:      Appearance: She is well-developed. She is not ill-appearing.  HENT:     Mouth/Throat:     Mouth: Mucous membranes are moist. No oral lesions.     Pharynx: Oropharynx is clear.     Tonsils: 2+ on the right. 2+ on the left.  Lymphadenopathy:     Head:     Right side of head: No submandibular adenopathy.     Left side of head: No submandibular adenopathy.     Cervical: No cervical adenopathy.  Neurological:     Mental Status: She is alert.      UC Treatments / Results  Labs (all labs ordered are listed, but only abnormal results are displayed) Labs Reviewed  POCT RAPID STREP A (OFFICE)  CYTOLOGY, (ORAL, ANAL, URETHRAL) ANCILLARY ONLY    EKG   Radiology No results found.  Procedures Procedures (including critical care time)  Medications Ordered in UC Medications - No data to display  Initial Impression / Assessment and Plan / UC Course  I have reviewed the triage vital signs and the nursing  notes.  Pertinent labs & imaging results that were available during my care of the patient were reviewed by me and considered in my medical decision making (see chart for details).     Rapid strep negative. I do not strongly suspect strep and will not send throat culture. STI pharyngeal swab sent at patient's request - she does have risk of STI.   I think her sore throat is most likely from post nasal drainage. Discussed supportive care.    Final Clinical Impressions(s) / UC Diagnoses   Final diagnoses:  Sore throat     Discharge Instructions      You will get a call if a test is positive or abnormal, you will not get a call if tests are negative  or normal but you can check results in MyChart if you have a MyChart account.   I recommend trying nasal spray and loratadine  (generic for Claritin ) to help your sore throat and the drainage down your throat.       ED Prescriptions     Medication Sig Dispense Auth. Provider   azelastine  (ASTELIN ) 0.1 % nasal spray Place 2 sprays into both nostrils 2 (two) times daily. Use in each nostril as directed 30 mL Richad Jon HERO, NP   loratadine  (CLARITIN ) 10 MG tablet Take 1 tablet (10 mg total) by mouth daily. 30 tablet Richad Jon HERO, NP   sodium chloride (OCEAN) 0.65 % SOLN nasal spray Place 1 spray into both nostrils as needed for congestion. 44 mL Richad Jon HERO, NP      PDMP not reviewed this encounter.   Richad Jon HERO, NP 05/05/24 [phone]

## 2024-05-05 NOTE — ED Triage Notes (Signed)
 Patient here today with c/o ST, PND, and an occasional cough X 1 week. Denies taking anything for symptoms. Her daughter is sick with allergies.

## 2024-05-08 LAB — CYTOLOGY, (ORAL, ANAL, URETHRAL) ANCILLARY ONLY
Chlamydia: NEGATIVE
Comment: NEGATIVE
Comment: NORMAL
Neisseria Gonorrhea: NEGATIVE

## 2024-05-31 ENCOUNTER — Ambulatory Visit (HOSPITAL_COMMUNITY): Admission: EM | Admit: 2024-05-31 | Discharge: 2024-05-31 | Disposition: A | Discharge status: Home / Self Care

## 2024-05-31 ENCOUNTER — Encounter (HOSPITAL_COMMUNITY): Payer: Self-pay

## 2024-05-31 DIAGNOSIS — N760 Acute vaginitis: Secondary | ICD-10-CM | POA: Insufficient documentation

## 2024-05-31 LAB — POCT URINE PREGNANCY: Preg Test, Ur: NEGATIVE

## 2024-05-31 NOTE — Discharge Instructions (Signed)
°  1. Acute vaginitis (Primary) - POCT urine pregnancy complaint UC is negative - Cervicovaginal swab collected in UC and sent to lab for further testing results should be available in 2 to 3 days. - Based on the findings of the cervical vaginal swab appropriate treatment will be provided.  -Continue to monitor symptoms for any change in severity if there is any escalation of current symptoms or development of new symptoms follow-up in ER for further evaluation and management.

## 2024-05-31 NOTE — ED Provider Notes (Signed)
 [facility] [place]  Note:  This document was prepared using Conservation officer, historic buildings and may include unintentional dictation errors.  MRN: [id number] DOB: 10-29-1995  Subjective:   Tasha Thompson is a 28 y.o. female presenting for vaginal discharge x 1 to 2 weeks.  Patient reports she normally has some vaginal discharge but states that this is lighter than normal.  Patient also reports some vaginal spotting but has now stopped.  Patient reports that she did have protected intercourse last week but states that condom broke during intercourse.  Patient has history of HSV and reports history of BV and yeast infection.  No significant dysuria, increased urinary frequency, abdominal pain, flank pain, vaginal pain.  Current Medications[1]   Allergies[2]  Past Medical History:  Diagnosis Date   Asthma    HSV infection      History reviewed. No pertinent surgical history.  History reviewed. No pertinent family history.  Social History[3]  ROS Refer to HPI for ROS details.  Objective:    Vitals: BP 117/72 (BP Location: Left Arm)   Pulse (!) 115   Temp 98.9 F (37.2 C) (Oral)   Resp 16   LMP 05/07/2024 (Exact Date)   SpO2 95%   Physical Exam Vitals and nursing note reviewed.  Constitutional:      General: She is not in acute distress.    Appearance: Normal appearance. She is well-developed. She is not ill-appearing or toxic-appearing.  HENT:     Head: Normocephalic and atraumatic.  Cardiovascular:     Rate and Rhythm: Normal rate.  Pulmonary:     Effort: Pulmonary effort is normal. No respiratory distress.     Breath sounds: No stridor. No wheezing.  Abdominal:     Tenderness: There is no abdominal tenderness. There is no right CVA tenderness or left CVA tenderness.  Genitourinary:    Vagina: Vaginal discharge present.  Skin:    General: Skin is warm and dry.  Neurological:     General: No focal deficit present.     Mental Status: She is alert and  oriented to person, place, and time.  Psychiatric:        Mood and Affect: Mood normal.        Behavior: Behavior normal.     Procedures  Results for orders placed or performed during the hospital encounter of 05/31/24 (from the past 24 hours)  POCT urine pregnancy     Status: None   Collection Time: 05/31/24  4:10 PM  Result Value Ref Range   Preg Test, Ur Negative Negative    Assessment and Plan :     Discharge Instructions       1. Acute vaginitis (Primary) - POCT urine pregnancy complaint UC is negative - Cervicovaginal swab collected in UC and sent to lab for further testing results should be available in 2 to 3 days. - Based on the findings of the cervical vaginal swab appropriate treatment will be provided.  -Continue to monitor symptoms for any change in severity if there is any escalation of current symptoms or development of new symptoms follow-up in ER for further evaluation and management.     Avid Guillette B Fionnuala Hemmerich    [1] No current facility-administered medications for this encounter.  Current Outpatient Medications:    azelastine  (ASTELIN ) 0.1 % nasal spray, Place 2 sprays into both nostrils 2 (two) times daily. Use in each nostril as directed, Disp: 30 mL, Rfl: 0   FLUoxetine (PROZAC) 10 MG capsule, Take 10 mg by  mouth daily., Disp: , Rfl:    loratadine  (CLARITIN ) 10 MG tablet, Take 1 tablet (10 mg total) by mouth daily., Disp: 30 tablet, Rfl: 0   sodium chloride (OCEAN) 0.65 % SOLN nasal spray, Place 1 spray into both nostrils as needed for congestion., Disp: 44 mL, Rfl: 0   valACYclovir (VALTREX) 1000 MG tablet, Take 1 tablet by mouth daily., Disp: , Rfl:  [2] No Known Allergies [3]  Social History Tobacco Use   Smoking status: Never   Smokeless tobacco: Never  Substance Use Topics   Alcohol use: No   Drug use: No     Aurea Goodell B, NP 05/31/24 1621

## 2024-05-31 NOTE — ED Triage Notes (Signed)
 Patient here today with c/o vaginal discharge X 1-2 weeks. Patient states that last week she had some light spotting but it stopped.

## 2024-06-01 ENCOUNTER — Ambulatory Visit (HOSPITAL_COMMUNITY)

## 2024-06-01 ENCOUNTER — Telehealth (HOSPITAL_COMMUNITY): Payer: Self-pay

## 2024-06-01 LAB — CERVICOVAGINAL ANCILLARY ONLY
Bacterial Vaginitis (gardnerella): NEGATIVE
Candida Glabrata: NEGATIVE
Candida Vaginitis: NEGATIVE
Chlamydia: NEGATIVE
Comment: NEGATIVE
Comment: NEGATIVE
Comment: NEGATIVE
Comment: NEGATIVE
Comment: NEGATIVE
Comment: NORMAL
Neisseria Gonorrhea: NEGATIVE
Trichomonas: NEGATIVE
